# Patient Record
Sex: Female | Born: 1970 | Race: White | Hispanic: No | Marital: Married | Smoking: Never smoker
Health system: Southern US, Community
[De-identification: ages and names within clinical notes are randomized; demographics above are authoritative.]

## PROBLEM LIST (undated history)

## (undated) DIAGNOSIS — J45909 Unspecified asthma, uncomplicated: Secondary | ICD-10-CM

## (undated) DIAGNOSIS — M1990 Unspecified osteoarthritis, unspecified site: Secondary | ICD-10-CM

## (undated) HISTORY — PX: BUNIONECTOMY: SHX129

---

## 1998-09-09 ENCOUNTER — Other Ambulatory Visit: Admission: RE | Admit: 1998-09-09 | Discharge: 1998-09-09 | Payer: Self-pay | Admitting: Obstetrics and Gynecology

## 2011-10-02 ENCOUNTER — Emergency Department (HOSPITAL_COMMUNITY)
Admission: EM | Admit: 2011-10-02 | Discharge: 2011-10-02 | Disposition: A | Discharge status: Home / Self Care | Payer: BC Managed Care – PPO | Attending: Emergency Medicine | Admitting: Emergency Medicine

## 2011-10-02 ENCOUNTER — Emergency Department (HOSPITAL_COMMUNITY): Payer: BC Managed Care – PPO

## 2011-10-02 ENCOUNTER — Other Ambulatory Visit: Payer: Self-pay

## 2011-10-02 DIAGNOSIS — R209 Unspecified disturbances of skin sensation: Secondary | ICD-10-CM | POA: Insufficient documentation

## 2011-10-02 DIAGNOSIS — M129 Arthropathy, unspecified: Secondary | ICD-10-CM | POA: Insufficient documentation

## 2011-10-02 DIAGNOSIS — R079 Chest pain, unspecified: Secondary | ICD-10-CM | POA: Insufficient documentation

## 2011-10-02 DIAGNOSIS — R0789 Other chest pain: Secondary | ICD-10-CM

## 2011-10-02 HISTORY — DX: Unspecified osteoarthritis, unspecified site: M19.90

## 2011-10-02 LAB — COMPREHENSIVE METABOLIC PANEL
Alkaline Phosphatase: 58 U/L (ref 39–117)
BUN: 10 mg/dL (ref 6–23)
Chloride: 102 mEq/L (ref 96–112)
Creatinine, Ser: 0.74 mg/dL (ref 0.50–1.10)
GFR calc Af Amer: >90 mL/min (ref >90)
Glucose, Bld: 89 mg/dL (ref 70–99)
Potassium: 4 mEq/L (ref 3.5–5.1)
Total Bilirubin: 0.3 mg/dL (ref 0.3–1.2)
Total Protein: 7.5 g/dL (ref 6.0–8.3)

## 2011-10-02 LAB — LIPASE, BLOOD: Lipase: 27 U/L (ref 11–59)

## 2011-10-02 LAB — POCT I-STAT, CHEM 8
Calcium, Ion: 1.16 mmol/L (ref 1.12–1.32)
Glucose, Bld: 121 mg/dL — ABNORMAL HIGH (ref 70–99)
HCT: 38 % (ref 36.0–46.0)
Hemoglobin: 12.9 g/dL (ref 12.0–15.0)
TCO2: 25 mmol/L (ref 0–100)

## 2011-10-02 MED ORDER — IBUPROFEN 800 MG PO TABS: 800.0000 mg | ORAL_TABLET | Freq: Three times a day (TID) | ORAL | Status: AC

## 2011-10-02 NOTE — ED Notes (Signed)
Family at bedside. 

## 2011-10-02 NOTE — ED Notes (Signed)
Cardiologist at bedside.  

## 2011-10-02 NOTE — ED Provider Notes (Signed)
Evaluation and management procedures were performed by the PA/NP under my supervision/collaboration.   Dione Booze, MD 10/02/11 [phone]

## 2011-10-02 NOTE — ED Provider Notes (Addendum)
History     CSN: [id number] Arrival date & time: 10/02/2011  1:37 PM   First MD Initiated Contact with Patient 10/02/11 1437      No chief complaint on file. CC:  Chest pain Pleasant 40 year old female with no significant past medical history other than arthritis. She reports fairly constant right-sided chest pain since Sunday. The pain was worse on Sunday and today is approximately 2/10. It initially began while she was doing some cleaning, but denies any specific exertional component. There is been no relation to food although patient still has her gallbladder. Today she states there was tingling in the right upper chest in the right arm. She denies nausea. Denies any shortness of breath to me. She did take some anti-inflammatory pain medications at home with moderate relief   (Consider location/radiation/quality/duration/timing/severity/associated sxs/prior treatment) HPI  Past Medical History  Diagnosis Date  . Arthritis     History reviewed. No pertinent past surgical history.  No family history on file.  History  Substance Use Topics  . Smoking status: Never Smoker   . Smokeless tobacco: Not on file  . Alcohol Use: Yes    OB History    Grav Para Term Preterm Abortions TAB SAB Ect Mult Living                  Review of Systems  All other systems reviewed and are negative.    Allergies  Ciprofloxacin hcl and Penicillins  Home Medications   Current Outpatient Rx  Name Route Sig Dispense Refill  . ALBUTEROL SULFATE HFA 108 (90 BASE) MCG/ACT IN AERS Inhalation Inhale 2 puffs into the lungs every 6 (six) hours as needed. Shortness of breath.     Marland Kitchen FLUTICASONE-SALMETEROL 250-50 MCG/DOSE IN AEPB Inhalation Inhale 1 puff into the lungs daily as needed.        BP 123/81  Pulse 89  Temp(Src) 97.8 F (36.6 C) (Oral)  Resp 16  SpO2 98%  LMP 09/18/2011  Physical Exam  Nursing note and vitals reviewed. Constitutional: She is oriented to person, place, and time.  She appears well-developed and well-nourished.  HENT:  Head: Normocephalic and atraumatic.  Eyes: Conjunctivae and EOM are normal. Pupils are equal, round, and reactive to light.  Neck: Neck supple.  Cardiovascular: Normal rate and regular rhythm.  Exam reveals no gallop and no friction rub.   No murmur heard. Pulmonary/Chest: Breath sounds normal. She has no wheezes. She has no rales. She exhibits no tenderness.  Abdominal: Soft. Bowel sounds are normal. She exhibits no distension. There is no tenderness. There is no rebound and no guarding.  Musculoskeletal: Normal range of motion.  Neurological: She is alert and oriented to person, place, and time. No cranial nerve deficit. Coordination normal.  Skin: Skin is warm and dry. No rash noted.  Psychiatric: She has a normal mood and affect.    ED Course  Procedures (including critical care time)  Labs Reviewed - No data to display No results found.   No diagnosis found.    MDM  Pt is seen and examined;  Initial history and physical completed.  Will follow.          Ryin Ambrosius A. Patrica Duel, MD 10/02/11 1446   Date: 10/02/2011  Rate: 80  Rhythm: normal sinus rhythm  QRS Axis: normal  Intervals: normal  ST/T Wave abnormalities: normal  Conduction Disutrbances:none  Narrative Interpretation:   Old EKG Reviewed: none available   No results found for this or any previous visit.  No results found.     Kierstynn Babich A. Patrica Duel, MD 10/02/11 1530   6:05 PM Spoke with Dr. Dannette Barbara. from [facility]. They will evaluate Ms. Clendenen in the ER and make further recommendations. She remained stable  Araya Roel A. Patrica Duel, MD 10/02/11 1610

## 2011-10-02 NOTE — Consult Note (Signed)
Reason for Consult: Chest pain  Requesting Physician: Dr. Patrica Duel  HPI: This is a 40 y.o. female with a past medical history significant for asthma, and family history of premature coronary disease. Her father is a patient of Dr. Tresa Endo in our practice. She reportedly developed right sided chest pain this Sunday while cleaning her cabinets. The pain is described as dull nonradiating and a focused on the right costochondral border. In addition she's had some sharp radiating pain and numbness and tingling in her right arm. Additionally she complains of some sharp pains in the right upper quadrant, not associated with eating or alleviated with rest or change in position. Her chest pain symptoms are not associated with exertion or relieved by rest and she's not attempted take any medication for it. She reports herself as active exercising with aerobic activities at the gym several times weekly. She denies any chest pain during any of these episodes. Chest also denies nausea, vomiting, presyncope, syncope, dizziness, worsening dyspnea on exertion, or other associated symptoms. At her mother's request he presented to the emergency department for evaluation.  PMHx:  Past Medical History  Diagnosis Date  . Arthritis    History reviewed. No pertinent past surgical history.  FAMHx:  her father had early onset coronary disease in his 76s.  SOCHx:  reports that she has never smoked. She does not have any smokeless tobacco history on file. She reports that she drinks alcohol. She reports that she does not use illicit drugs.  ALLERGIES: Allergies  Allergen Reactions  . Ciprofloxacin Hcl Anaphylaxis  . Penicillins Hives    ROS: A comprehensive review of systems was negative except for: Cardiovascular: positive for chest pain  HOME MEDICATIONS:  (Not in a hospital admission)  HOSPITAL MEDICATIONS: Prior to Admission:  (Not in a hospital admission)  VITALS: Blood pressure 117/69, pulse 77,  temperature 97.6 F (36.4 C), temperature source Oral, resp. rate 15, last menstrual period 09/18/2011, SpO2 96.00%.  PHYSICAL EXAM: General appearance: alert and no distress Neck: no adenopathy, no carotid bruit, no JVD, supple, symmetrical, trachea midline and thyroid not enlarged, symmetric, no tenderness/mass/nodules Lungs: clear to auscultation bilaterally Heart: regular rate and rhythm, S1, S2 normal, no murmur, click, rub or gallop Abdomen: soft, mildly tender to deep palpation. No hepatomegaly, no Murphy's sign was able to be elicited. Extremities: extremities normal, atraumatic, no cyanosis or edema Pulses: 2+ and symmetric Skin: Skin color, texture, turgor normal. No rashes or lesions Neurologic: Grossly normal  LABS: Results for orders placed during the hospital encounter of 10/02/11 (from the past 48 hour(s))  COMPREHENSIVE METABOLIC PANEL     Status: Normal   Collection Time   10/02/11  3:10 PM      Component Value Range Comment   Sodium 137  135 - 145 (mEq/L)    Potassium 4.0  3.5 - 5.1 (mEq/L)    Chloride 102  96 - 112 (mEq/L)    CO2 23  19 - 32 (mEq/L)    Glucose, Bld 89  70 - 99 (mg/dL)    BUN 10  6 - 23 (mg/dL)    Creatinine, Ser 1.47  0.50 - 1.10 (mg/dL)    Calcium 9.2  8.4 - 10.5 (mg/dL)    Total Protein 7.5  6.0 - 8.3 (g/dL)    Albumin 4.2  3.5 - 5.2 (g/dL)    AST 15  0 - 37 (U/L)    ALT 12  0 - 35 (U/L)    Alkaline Phosphatase 58  39 -  117 (U/L)    Total Bilirubin 0.3  0.3 - 1.2 (mg/dL)    GFR calc non Af Amer >90  >90 (mL/min)    GFR calc Af Amer >90  >90 (mL/min)   LIPASE, BLOOD     Status: Normal   Collection Time   10/02/11  3:10 PM      Component Value Range Comment   Lipase 27  11 - 59 (U/L)   PREGNANCY, URINE     Status: Normal   Collection Time   10/02/11  3:23 PM      Component Value Range Comment   Preg Test, Ur NEGATIVE     POCT I-STAT TROPONIN I     Status: Normal   Collection Time   10/02/11  4:47 PM      Component Value Range Comment    Troponin i, poc 0.00  0.00 - 0.08 (ng/mL)    Comment 3            POCT I-STAT, CHEM 8     Status: Abnormal   Collection Time   10/02/11  4:50 PM      Component Value Range Comment   Sodium 139  135 - 145 (mEq/L)    Potassium 4.1  3.5 - 5.1 (mEq/L)    Chloride 101  96 - 112 (mEq/L)    BUN 8  6 - 23 (mg/dL)    Creatinine, Ser 4.09  0.50 - 1.10 (mg/dL)    Glucose, Bld 811 (*) 70 - 99 (mg/dL)    Calcium, Ion 9.14  1.12 - 1.32 (mmol/L)    TCO2 25  0 - 100 (mmol/L)    Hemoglobin 12.9  12.0 - 15.0 (g/dL)    HCT 78.2  95.6 - 21.3 (%)     IMAGING: Dg Chest 2 View  10/02/2011  *RADIOLOGY REPORT*  Clinical Data: Chest pain.  Shortness of breath.  Right upper extremity numbness.  CHEST - 2 VIEW 10/02/2011:  Comparison: None.  Findings: Cardiomediastinal silhouette unremarkable.  Lungs clear. Bronchovascular markings normal.  Pulmonary vascularity normal.  No pleural effusions.  No pneumothorax.  Visualized bony thorax intact.  IMPRESSION: Normal examination.  Original Report Authenticated By: Arnell Sieving, M.D.   EKG: Normal sinus rhythm without ischemic changes.  IMPRESSION: 1. Noncardiac chest pain 2. Possible costochondritis or chest wall pain.  RECOMMENDATION: 1. A course of anti-inflammatory medication. I recommended ibuprofen 600 mg by mouth twice a day x7 days.  2. There is no adequate clear indication for further stress testing as an outpatient. However if her symptoms worsen want to consider stress perfusion imaging. 3. She can be discharged home with no specific followup needed.  Thanks for the consult.  Chrystie Nose, MD Attending Cardiologist [facility]  Time Spent Directly with Patient: 30 minutes  Sheena Donegan C 10/02/2011, 8:58 PM

## 2011-10-02 NOTE — ED Notes (Signed)
MD at bedside. 

## 2011-10-02 NOTE — ED Provider Notes (Signed)
  Physical Exam  BP 117/69  Pulse 77  Temp(Src) 97.6 F (36.4 C) (Oral)  Resp 15  SpO2 96%  LMP 09/18/2011  Physical Exam  ED Course  Procedures Results for orders placed during the hospital encounter of 10/02/11  COMPREHENSIVE METABOLIC PANEL      Component Value Range   Sodium 137  135 - 145 (mEq/L)   Potassium 4.0  3.5 - 5.1 (mEq/L)   Chloride 102  96 - 112 (mEq/L)   CO2 23  19 - 32 (mEq/L)   Glucose, Bld 89  70 - 99 (mg/dL)   BUN 10  6 - 23 (mg/dL)   Creatinine, Ser 4.54  0.50 - 1.10 (mg/dL)   Calcium 9.2  8.4 - 09.8 (mg/dL)   Total Protein 7.5  6.0 - 8.3 (g/dL)   Albumin 4.2  3.5 - 5.2 (g/dL)   AST 15  0 - 37 (U/L)   ALT 12  0 - 35 (U/L)   Alkaline Phosphatase 58  39 - 117 (U/L)   Total Bilirubin 0.3  0.3 - 1.2 (mg/dL)   GFR calc non Af Amer >90  >90 (mL/min)   GFR calc Af Amer >90  >90 (mL/min)  LIPASE, BLOOD      Component Value Range   Lipase 27  11 - 59 (U/L)  PREGNANCY, URINE      Component Value Range   Preg Test, Ur NEGATIVE    POCT I-STAT, CHEM 8      Component Value Range   Sodium 139  135 - 145 (mEq/L)   Potassium 4.1  3.5 - 5.1 (mEq/L)   Chloride 101  96 - 112 (mEq/L)   BUN 8  6 - 23 (mg/dL)   Creatinine, Ser 1.19  0.50 - 1.10 (mg/dL)   Glucose, Bld 147 (*) 70 - 99 (mg/dL)   Calcium, Ion 8.29  5.62 - 1.32 (mmol/L)   TCO2 25  0 - 100 (mmol/L)   Hemoglobin 12.9  12.0 - 15.0 (g/dL)   HCT 13.0  86.5 - 78.4 (%)  POCT I-STAT TROPONIN I      Component Value Range   Troponin i, poc 0.00  0.00 - 0.08 (ng/mL)   Comment 3             MDM 8:54 PM Dr. Rennis Golden has seen the patient. Feels this is less likely cardiac pain. Only risk factor for cardiac disease is family history of early cardiac disease. States he has been greater than 24 hours. Does not feel the stress test as needed outpatient. Recommend ibuprofen and followup with primary care physician if needed. Patient informed of this and is ready for discharge.      Thomasene Lot, [place] 10/02/11  2055

## 2011-10-02 NOTE — ED Notes (Signed)
Pt presents with 2 week h/o chest pain that resolved on it's own x 15 minutes.  Pt reports since Sunday, while she was cleaning with onset of epigastric pain that has been intermittent but x 2 days, has been into mid chest. Pt reports pain radiates into R arm and through to midscapula.  +shortness of breath, -nausea.

## 2013-01-23 IMAGING — CR DG CHEST 2V
2 series · 2 of 2 positions shown · non-contrast
Comparison: None.

CLINICAL DATA: Chest pain.  Shortness of breath.  Right upper
extremity numbness.

CHEST - 2 VIEW 10/02/2011:

[w chest pa]
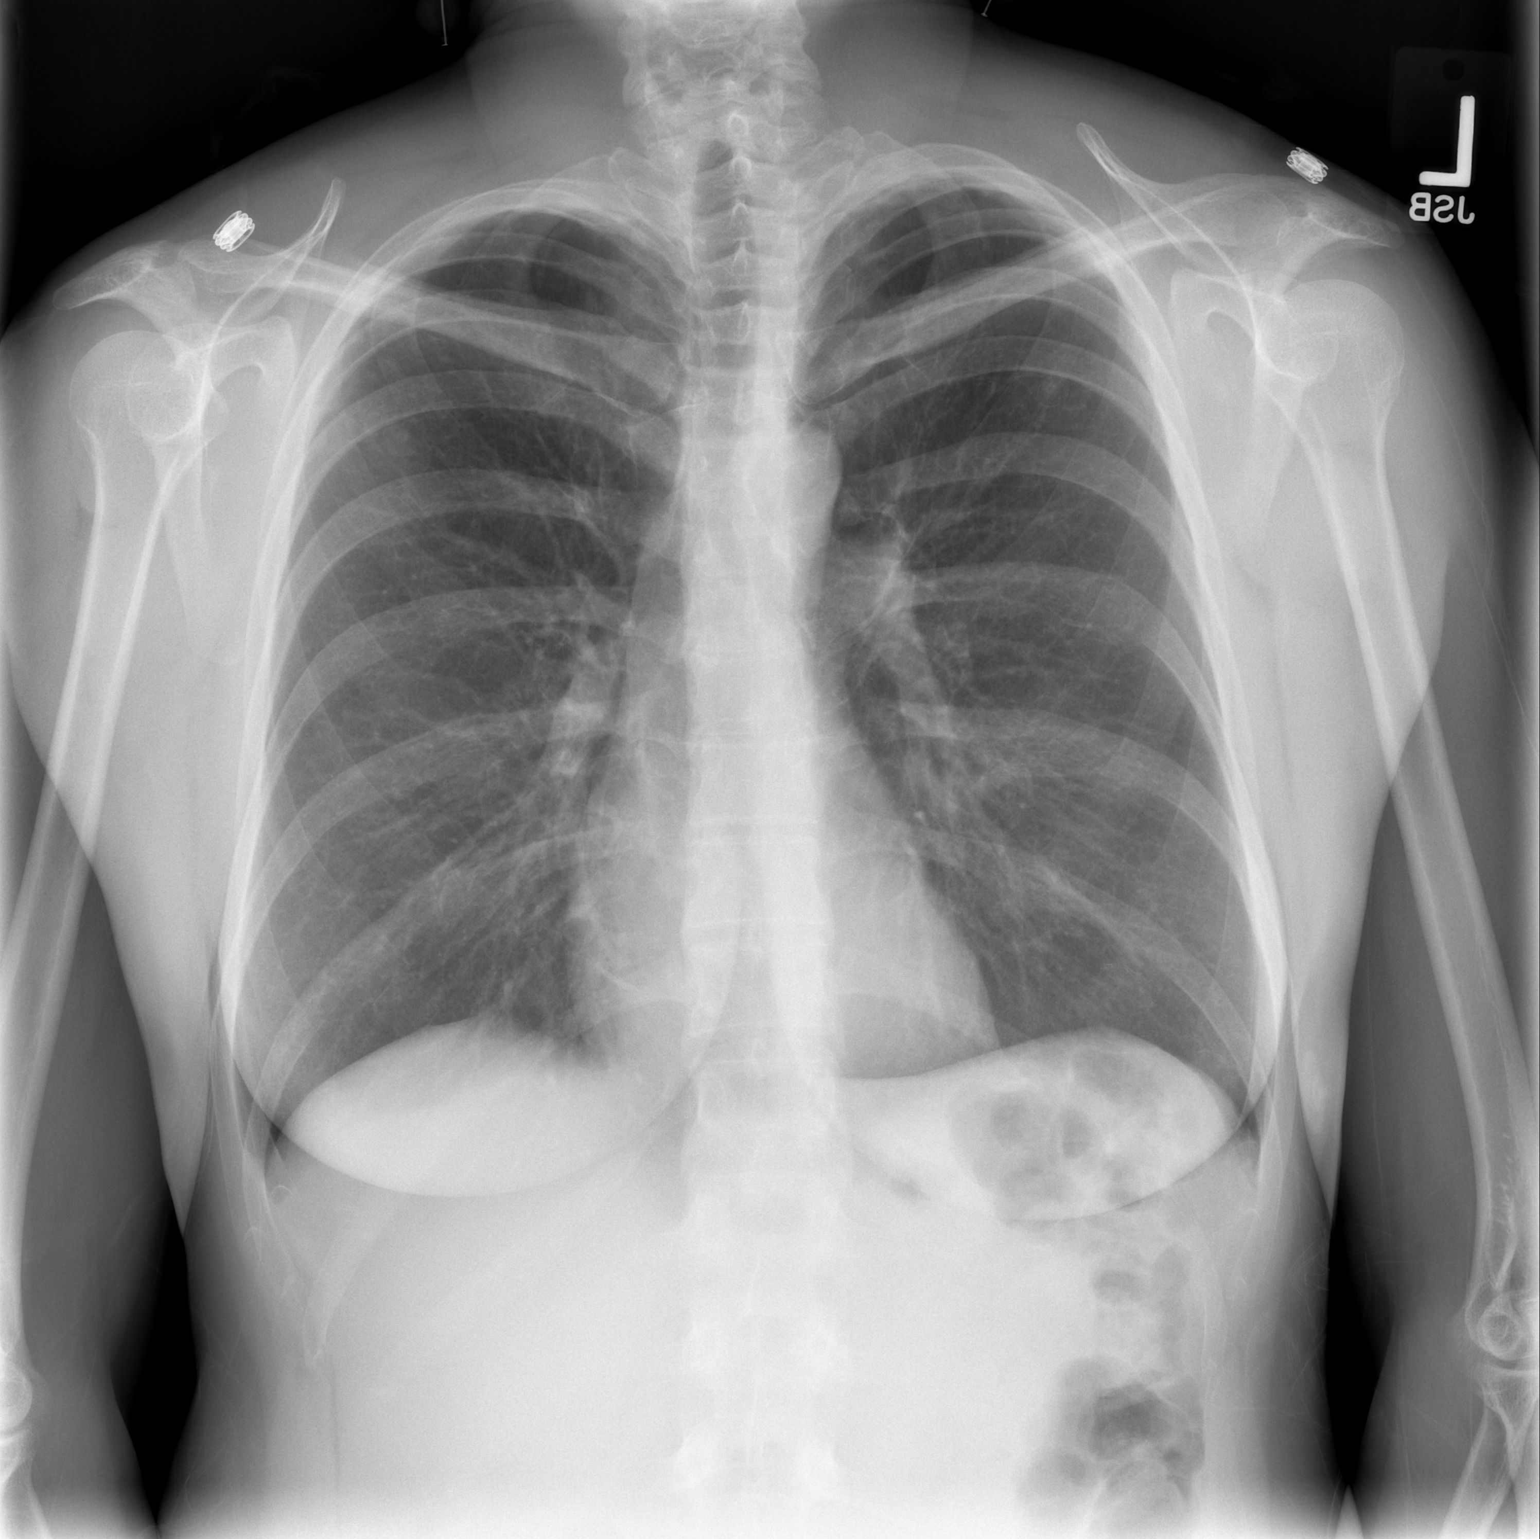

[w chest lat]
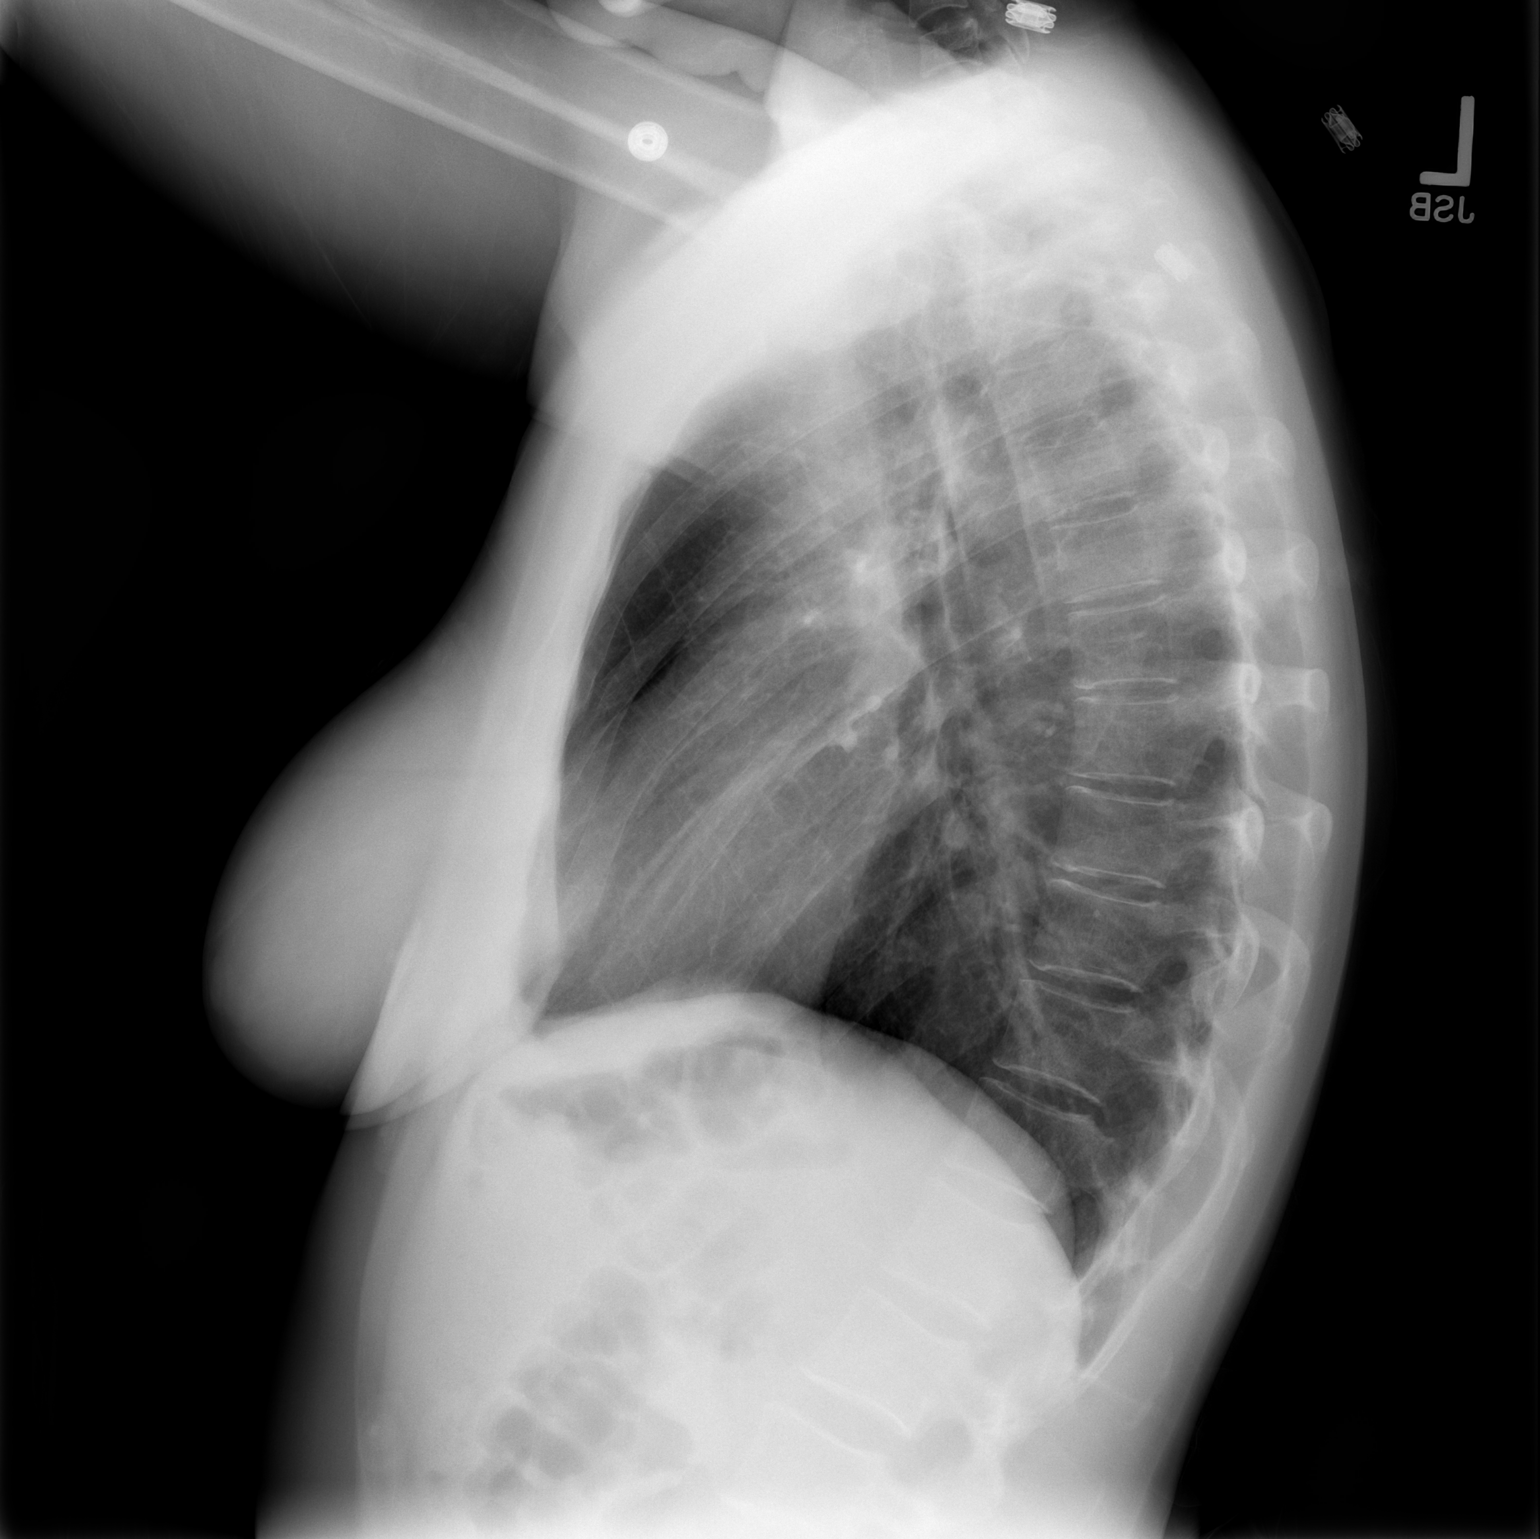

[2 of 2 positions shown; findings below may reference images not displayed]

FINDINGS: Cardiomediastinal silhouette unremarkable.  Lungs clear.
Bronchovascular markings normal.  Pulmonary vascularity normal.  No
pleural effusions.  No pneumothorax.  Visualized bony thorax
intact.
IMPRESSION: Normal examination.

## 2013-06-09 ENCOUNTER — Encounter (INDEPENDENT_AMBULATORY_CARE_PROVIDER_SITE_OTHER): Payer: Self-pay

## 2013-06-25 ENCOUNTER — Ambulatory Visit (INDEPENDENT_AMBULATORY_CARE_PROVIDER_SITE_OTHER): Payer: Self-pay | Admitting: Surgery

## 2013-07-01 ENCOUNTER — Encounter (INDEPENDENT_AMBULATORY_CARE_PROVIDER_SITE_OTHER): Payer: Self-pay | Admitting: Surgery

## 2013-07-01 ENCOUNTER — Other Ambulatory Visit (INDEPENDENT_AMBULATORY_CARE_PROVIDER_SITE_OTHER): Payer: Self-pay

## 2013-07-01 ENCOUNTER — Ambulatory Visit (INDEPENDENT_AMBULATORY_CARE_PROVIDER_SITE_OTHER): Payer: BC Managed Care – PPO | Admitting: Surgery

## 2013-07-01 ENCOUNTER — Telehealth (INDEPENDENT_AMBULATORY_CARE_PROVIDER_SITE_OTHER): Payer: Self-pay | Admitting: General Surgery

## 2013-07-01 VITALS — BP 112/78 | HR 68 | Resp 14 | Ht 63.0 in | Wt 149.6 lb

## 2013-07-01 DIAGNOSIS — J45909 Unspecified asthma, uncomplicated: Secondary | ICD-10-CM | POA: Insufficient documentation

## 2013-07-01 DIAGNOSIS — K8020 Calculus of gallbladder without cholecystitis without obstruction: Secondary | ICD-10-CM

## 2013-07-01 NOTE — Progress Notes (Signed)
Chief Complaint:  Right side abdominal pain and presumed gallstones  History of Present Illness:  Brittany Alvarado is an 42 y.o. female who comes today referred by her primary care physicians in [place], and [place] primary care with a history of recurrent right-sided abdominal pain.  This pain is at times pretty severe, not related to meals, and may last several hours.  No nausea or vomiting or diarrhea noted.  No Fevers.  Pain has been getting more frequent.   I have a report from an in office ultrasound done and read by a radiology group in [place] that showed some shadowing in the gallbladder that may be gallstones or sludge as well as kidney stones. In 2012 she did go to the emergency room with chest pain which was unclear etiology which may have been the gallbladder attack. I gave her a gallbladder booklet and reviewed the procedure and its risk and complications in detail taking approximately 30 minutes to go over such issues.  She would like to repeat the ultrasound to see if they can be more precise about this diagnosis. I will see her back after the ultrasound and we can determine whether we need to proceed with surgery or not.  Past Medical History  Diagnosis Date  . Arthritis     History reviewed. No pertinent past surgical history.  Current Outpatient Prescriptions  Medication Sig Dispense Refill  . albuterol (PROVENTIL HFA;VENTOLIN HFA) 108 (90 BASE) MCG/ACT inhaler Inhale 2 puffs into the lungs every 6 (six) hours as needed. Shortness of breath.       . Fluticasone-Salmeterol (ADVAIR) 250-50 MCG/DOSE AEPB Inhale 1 puff into the lungs daily as needed.         No current facility-administered medications for this visit.   Ciprofloxacin hcl and Penicillins Family History  Problem Relation Age of Onset  . Cancer Mother     uterine  . Cancer Father     prostate  . Cancer Maternal Grandmother     colon   Social History:   reports that she has never smoked. She has never used  smokeless tobacco. She reports that  drinks alcohol. She reports that she does not use illicit drugs.   REVIEW OF SYSTEMS - PERTINENT POSITIVES ONLY: Reviewed in the chart and is positive for asthma and is otherwise negative.  Physical Exam:   Blood pressure 112/78, pulse 68, resp. rate 14, height 5\' 3"  (1.6 m), weight 149 lb 9.6 oz (67.858 kg). Body mass index is 26.51 kg/(m^2).  Gen:  WDWN white female NAD  Neurological: Alert and oriented to person, place, and time. Motor and sensory function is grossly intact  Head: Normocephalic and atraumatic.  Eyes: Conjunctivae are normal. Pupils are equal, round, and reactive to light. No scleral icterus.  Abdomen:  Not reporting any abdominal pain at present LABORATORY RESULTS: No results found for this or any previous visit (from the past 48 hour(s)).  RADIOLOGY RESULTS: No results found.  Problem List: There are no active problems to display for this patient.   Assessment & Plan: Probable gallstones that are probably causing her right upper quadrant pain.  Will recheck her ultrasound here and pay attention to her gallbladder and her kidneys.      Matt B. Daphine Deutscher, MD, [facility] [phone] beeper [phone]  07/01/2013 9:36 AM

## 2013-07-01 NOTE — Telephone Encounter (Signed)
Pt called to report she has a procedure scheduled at [facility] that will need to be rescheduled because her employer cannot let her off at that time to go for it.  Suggested she reschedule it for when she can get there, and let us know so we can then get her in to see Dr. Daphine Deutscher afterwards.

## 2013-07-01 NOTE — Patient Instructions (Signed)

## 2013-07-03 ENCOUNTER — Encounter (INDEPENDENT_AMBULATORY_CARE_PROVIDER_SITE_OTHER): Payer: BC Managed Care – PPO | Admitting: Surgery

## 2013-07-03 ENCOUNTER — Other Ambulatory Visit: Payer: BC Managed Care – PPO

## 2013-09-01 ENCOUNTER — Other Ambulatory Visit: Payer: BC Managed Care – PPO

## 2013-09-04 ENCOUNTER — Encounter (INDEPENDENT_AMBULATORY_CARE_PROVIDER_SITE_OTHER): Payer: BC Managed Care – PPO | Admitting: Surgery

## 2020-06-23 ENCOUNTER — Encounter (INDEPENDENT_AMBULATORY_CARE_PROVIDER_SITE_OTHER): Payer: Self-pay | Admitting: *Deleted

## 2020-08-08 ENCOUNTER — Other Ambulatory Visit (INDEPENDENT_AMBULATORY_CARE_PROVIDER_SITE_OTHER): Payer: Self-pay

## 2020-08-08 DIAGNOSIS — Z800 Family history of malignant neoplasm of digestive organs: Secondary | ICD-10-CM

## 2020-08-16 ENCOUNTER — Other Ambulatory Visit (INDEPENDENT_AMBULATORY_CARE_PROVIDER_SITE_OTHER): Payer: Self-pay

## 2020-08-18 ENCOUNTER — Other Ambulatory Visit (INDEPENDENT_AMBULATORY_CARE_PROVIDER_SITE_OTHER): Payer: Self-pay

## 2020-08-18 DIAGNOSIS — Z800 Family history of malignant neoplasm of digestive organs: Secondary | ICD-10-CM

## 2020-08-19 ENCOUNTER — Encounter (INDEPENDENT_AMBULATORY_CARE_PROVIDER_SITE_OTHER): Payer: Self-pay | Admitting: *Deleted

## 2020-08-19 ENCOUNTER — Telehealth (INDEPENDENT_AMBULATORY_CARE_PROVIDER_SITE_OTHER): Payer: Self-pay

## 2020-08-19 ENCOUNTER — Encounter (INDEPENDENT_AMBULATORY_CARE_PROVIDER_SITE_OTHER): Payer: Self-pay

## 2020-08-19 DIAGNOSIS — Z800 Family history of malignant neoplasm of digestive organs: Secondary | ICD-10-CM

## 2020-08-19 NOTE — Telephone Encounter (Signed)
Patient has Plenvu (copay card) 

## 2020-08-19 NOTE — Telephone Encounter (Signed)
Referring MD/PCP:Daniel   Procedure: Tcs  Reason/Indication:  Family hx of colon ca  Has patient had this procedure before?  no  If so, when, by whom and where?    Is there a family history of colon cancer?  Yes maternal grandmother  Who?  What age when diagnosed?    Is patient diabetic?   no      Does patient have prosthetic heart valve or mechanical valve?  no  Do you have a pacemaker/defibrillator?  no  Has patient ever had endocarditis/atrial fibrillation? no  Does patient use oxygen? no  Has patient had joint replacement within last 12 months?  no  Is patient constipated or do they take laxatives? yes  Does patient have a history of alcohol/drug use?  no  Is patient on blood thinner such as Coumadin, Plavix and/or Aspirin? no  Medications: Albuterol prn, Advair 250/50 bid, linzess 72 mcg daily, hctz 25mg  daily  Allergies: cipro  Medication Adjustment per Dr none  Procedure date & time: 09/21/20

## 2020-08-25 MED ORDER — PLENVU 140 G PO SOLR: 1.0000 | Freq: Once | ORAL | 0 refills | Status: AC

## 2020-08-25 NOTE — Telephone Encounter (Signed)
Ok to schedule.  Thanks,  Rehan Holness Castaneda Mayorga, MD Gastroenterology and Hepatology [facility]  

## 2020-09-19 ENCOUNTER — Other Ambulatory Visit: Payer: Self-pay

## 2020-09-19 ENCOUNTER — Other Ambulatory Visit (HOSPITAL_COMMUNITY)
Admission: RE | Admit: 2020-09-19 | Discharge: 2020-09-19 | Disposition: A | Discharge status: Home / Self Care | Payer: BC Managed Care – PPO | Source: Ambulatory Visit | Attending: Gastroenterology | Admitting: Gastroenterology

## 2020-09-19 DIAGNOSIS — Z881 Allergy status to other antibiotic agents status: Secondary | ICD-10-CM | POA: Diagnosis not present

## 2020-09-19 DIAGNOSIS — K648 Other hemorrhoids: Secondary | ICD-10-CM | POA: Diagnosis not present

## 2020-09-19 DIAGNOSIS — Z20822 Contact with and (suspected) exposure to covid-19: Secondary | ICD-10-CM | POA: Insufficient documentation

## 2020-09-19 DIAGNOSIS — Z1211 Encounter for screening for malignant neoplasm of colon: Secondary | ICD-10-CM | POA: Diagnosis not present

## 2020-09-19 DIAGNOSIS — Z01812 Encounter for preprocedural laboratory examination: Secondary | ICD-10-CM | POA: Insufficient documentation

## 2020-09-19 DIAGNOSIS — Z880 Allergy status to penicillin: Secondary | ICD-10-CM | POA: Diagnosis not present

## 2020-09-19 DIAGNOSIS — Z7951 Long term (current) use of inhaled steroids: Secondary | ICD-10-CM | POA: Diagnosis not present

## 2020-09-19 DIAGNOSIS — Z79899 Other long term (current) drug therapy: Secondary | ICD-10-CM | POA: Diagnosis not present

## 2020-09-19 DIAGNOSIS — Z800 Family history of malignant neoplasm of digestive organs: Secondary | ICD-10-CM | POA: Diagnosis not present

## 2020-09-19 LAB — PREGNANCY, URINE: Preg Test, Ur: NEGATIVE

## 2020-09-20 LAB — SARS CORONAVIRUS 2 (TAT 6-24 HRS): SARS Coronavirus 2: NEGATIVE

## 2020-09-21 ENCOUNTER — Ambulatory Visit (HOSPITAL_COMMUNITY)
Admission: RE | Admit: 2020-09-21 | Discharge: 2020-09-21 | Disposition: A | Discharge status: Home / Self Care | Payer: BC Managed Care – PPO | Attending: Gastroenterology | Admitting: Gastroenterology

## 2020-09-21 ENCOUNTER — Encounter (HOSPITAL_COMMUNITY): Payer: Self-pay | Admitting: Gastroenterology

## 2020-09-21 ENCOUNTER — Encounter (HOSPITAL_COMMUNITY)
Admission: RE | Disposition: A | Discharge status: Home / Self Care | Payer: Self-pay | Source: Home / Self Care | Attending: Gastroenterology

## 2020-09-21 ENCOUNTER — Other Ambulatory Visit: Payer: Self-pay

## 2020-09-21 ENCOUNTER — Ambulatory Visit (HOSPITAL_COMMUNITY): Payer: BC Managed Care – PPO | Admitting: Anesthesiology

## 2020-09-21 ENCOUNTER — Encounter (INDEPENDENT_AMBULATORY_CARE_PROVIDER_SITE_OTHER): Payer: Self-pay | Admitting: *Deleted

## 2020-09-21 DIAGNOSIS — K648 Other hemorrhoids: Secondary | ICD-10-CM | POA: Insufficient documentation

## 2020-09-21 DIAGNOSIS — Z1211 Encounter for screening for malignant neoplasm of colon: Secondary | ICD-10-CM | POA: Diagnosis not present

## 2020-09-21 DIAGNOSIS — Z800 Family history of malignant neoplasm of digestive organs: Secondary | ICD-10-CM | POA: Insufficient documentation

## 2020-09-21 DIAGNOSIS — Z7951 Long term (current) use of inhaled steroids: Secondary | ICD-10-CM | POA: Insufficient documentation

## 2020-09-21 DIAGNOSIS — Z20822 Contact with and (suspected) exposure to covid-19: Secondary | ICD-10-CM | POA: Insufficient documentation

## 2020-09-21 DIAGNOSIS — Z880 Allergy status to penicillin: Secondary | ICD-10-CM | POA: Insufficient documentation

## 2020-09-21 DIAGNOSIS — Z881 Allergy status to other antibiotic agents status: Secondary | ICD-10-CM | POA: Insufficient documentation

## 2020-09-21 DIAGNOSIS — Z79899 Other long term (current) drug therapy: Secondary | ICD-10-CM | POA: Insufficient documentation

## 2020-09-21 HISTORY — DX: Unspecified asthma, uncomplicated: J45.909

## 2020-09-21 HISTORY — PX: COLONOSCOPY WITH PROPOFOL: SHX5780

## 2020-09-21 LAB — HM COLONOSCOPY

## 2020-09-21 SURGERY — COLONOSCOPY WITH PROPOFOL
Anesthesia: General

## 2020-09-21 MED ORDER — STERILE WATER FOR IRRIGATION IR SOLN
Status: DC | PRN
  Administered 2020-09-21: 1.5 mL

## 2020-09-21 MED ORDER — PROPOFOL 10 MG/ML IV BOLUS
INTRAVENOUS | Status: DC | PRN
  Administered 2020-09-21: 10 mg via INTRAVENOUS
  Administered 2020-09-21 (×2): 30 mg via INTRAVENOUS
  Administered 2020-09-21 (×2): 50 mg via INTRAVENOUS

## 2020-09-21 MED ORDER — LACTATED RINGERS IV SOLN
Freq: Once | INTRAVENOUS | Status: AC
  Administered 2020-09-21: 1000 mL via INTRAVENOUS

## 2020-09-21 MED ORDER — LACTATED RINGERS IV SOLN: INTRAVENOUS | Status: DC | PRN

## 2020-09-21 MED ORDER — PROPOFOL 500 MG/50ML IV EMUL
INTRAVENOUS | Status: DC | PRN
  Administered 2020-09-21: 150 ug/kg/min via INTRAVENOUS

## 2020-09-21 NOTE — Anesthesia Preprocedure Evaluation (Signed)
Anesthesia Evaluation  Patient identified by MRN, date of birth, ID band Patient awake    Reviewed: Allergy & Precautions, NPO status , Patient's Chart, lab work & pertinent test results  History of Anesthesia Complications Negative for: history of anesthetic complications  Airway Mallampati: II  TM Distance: >3 FB Neck ROM: Full    Dental  (+) Dental Advisory Given, Teeth Intact   Pulmonary asthma ,    Pulmonary exam normal breath sounds clear to auscultation       Cardiovascular Exercise Tolerance: Good negative cardio ROS Normal cardiovascular exam Rhythm:Regular Rate:Normal     Neuro/Psych    GI/Hepatic negative GI ROS, Neg liver ROS,   Endo/Other  negative endocrine ROS  Renal/GU negative Renal ROS     Musculoskeletal  (+) Arthritis ,   Abdominal   Peds  Hematology   Anesthesia Other Findings Bilateral ankle edema - HCTZ  Reproductive/Obstetrics                             Anesthesia Physical Anesthesia Plan  ASA: II  Anesthesia Plan: General   Post-op Pain Management:    Induction: Intravenous  PONV Risk Score and Plan: TIVA  Airway Management Planned: Nasal Cannula and Natural Airway  Additional Equipment:   Intra-op Plan:   Post-operative Plan:   Informed Consent: I have reviewed the patients History and Physical, chart, labs and discussed the procedure including the risks, benefits and alternatives for the proposed anesthesia with the patient or authorized representative who has indicated his/her understanding and acceptance.     Dental advisory given  Plan Discussed with: CRNA and Surgeon  Anesthesia Plan Comments:         Anesthesia Quick Evaluation

## 2020-09-21 NOTE — Transfer of Care (Signed)
Immediate Anesthesia Transfer of Care Note  Patient: Brittany Alvarado  Procedure(s) Performed: COLONOSCOPY WITH PROPOFOL (N/A )  Patient Location: Endoscopy Unit  Anesthesia Type:General  Level of Consciousness: awake  Airway & Oxygen Therapy: Patient Spontanous Breathing  Post-op Assessment: Report given to RN  Post vital signs: Reviewed  Last Vitals:  Vitals Value Taken Time  BP 117/79 09/21/20 1003  Temp 36.8 C 09/21/20 1003  Pulse 75 09/21/20 1003  Resp 20 09/21/20 1003  SpO2 100 % 09/21/20 1003    Last Pain:  Vitals:   09/21/20 1003  TempSrc: Oral  PainSc: 0-No pain      Patients Stated Pain Goal: 8 (09/21/20 0859)  Complications: No complications documented.

## 2020-09-21 NOTE — Discharge Instructions (Signed)
Colonoscopy, Adult, Care After This sheet gives you information about how to care for yourself after your procedure. Your doctor may also give you more specific instructions. If you have problems or questions, call your doctor. What can I expect after the procedure? After the procedure, it is common to have:  A small amount of blood in your poop (stool) for 24 hours.  Some gas.  Mild cramping or bloating in your belly (abdomen). Follow these instructions at home: Eating and drinking   Drink enough fluid to keep your pee (urine) pale yellow.  Follow instructions from your doctor about what you cannot eat or drink.  Return to your normal diet as told by your doctor. Avoid heavy or fried foods that are hard to digest. Activity  Rest as told by your doctor.  Do not sit for a long time without moving. Get up to take short walks every 1-2 hours. This is important. Ask for help if you feel weak or unsteady.  Return to your normal activities as told by your doctor. Ask your doctor what activities are safe for you. To help cramping and bloating:   Try walking around.  Put heat on your belly as told by your doctor. Use the heat source that your doctor recommends, such as a moist heat pack or a heating pad. ? Put a towel between your skin and the heat source. ? Leave the heat on for 20-30 minutes. ? Remove the heat if your skin turns bright red. This is very important if you are unable to feel pain, heat, or cold. You may have a greater risk of getting burned. General instructions  For the first 24 hours after the procedure: ? Do not drive or use machinery. ? Do not sign important documents. ? Do not drink alcohol. ? Do your daily activities more slowly than normal. ? Eat foods that are soft and easy to digest.  Take over-the-counter or prescription medicines only as told by your doctor.  Keep all follow-up visits as told by your doctor. This is important. Contact a doctor  if:  You have blood in your poop 2-3 days after the procedure. Get help right away if:  You have more than a small amount of blood in your poop.  You see large clumps of tissue (blood clots) in your poop.  Your belly is swollen.  You feel like you may vomit (nauseous).  You vomit.  You have a fever.  You have belly pain that gets worse, and medicine does not help your pain. Summary  After the procedure, it is common to have a small amount of blood in your poop. You may also have mild cramping and bloating in your belly.  For the first 24 hours after the procedure, do not drive or use machinery, do not sign important documents, and do not drink alcohol.  Get help right away if you have a lot of blood in your poop, feel like you may vomit, have a fever, or have more belly pain. This information is not intended to replace advice given to you by your health care provider. Make sure you discuss any questions you have with your health care provider. Document Revised: 05/11/2019 Document Reviewed: 05/11/2019 Elsevier Patient Education  2020 [employer]. Hemorrhoids Hemorrhoids are swollen veins that may develop:  In the butt (rectum). These are called internal hemorrhoids.  Around the opening of the butt (anus). These are called external hemorrhoids. Hemorrhoids can cause pain, itching, or bleeding. Most of  the time, they do not cause serious problems. They usually get better with diet changes, lifestyle changes, and other home treatments. What are the causes? This condition may be caused by:  Having trouble pooping (constipation).  Pushing hard (straining) to poop.  Watery poop (diarrhea).  Pregnancy.  Being very overweight (obese).  Sitting for long periods of time.  Heavy lifting or other activity that causes you to strain.  Anal sex.  Riding a bike for a long period of time. What are the signs or symptoms? Symptoms of this condition include:  Pain.  Itching  or soreness in the butt.  Bleeding from the butt.  Leaking poop.  Swelling in the area.  One or more lumps around the opening of your butt. How is this diagnosed? A doctor can often diagnose this condition by looking at the affected area. The doctor may also:  Do an exam that involves feeling the area with a gloved hand (digital rectal exam).  Examine the area inside your butt using a small tube (anoscope).  Order blood tests. This may be done if you have lost a lot of blood.  Have you get a test that involves looking inside the colon using a flexible tube with a camera on the end (sigmoidoscopy or colonoscopy). How is this treated? This condition can usually be treated at home. Your doctor may tell you to change what you eat, make lifestyle changes, or try home treatments. If these do not help, procedures can be done to remove the hemorrhoids or make them smaller. These may involve:  Placing rubber bands at the base of the hemorrhoids to cut off their blood supply.  Injecting medicine into the hemorrhoids to shrink them.  Shining a type of light energy onto the hemorrhoids to cause them to fall off.  Doing surgery to remove the hemorrhoids or cut off their blood supply. Follow these instructions at home: Eating and drinking   Eat foods that have a lot of fiber in them. These include whole grains, beans, nuts, fruits, and vegetables.  Ask your doctor about taking products that have added fiber (fibersupplements).  Reduce the amount of fat in your diet. You can do this by: ? Eating low-fat dairy products. ? Eating less red meat. ? Avoiding processed foods.  Drink enough fluid to keep your pee (urine) pale yellow. Managing pain and swelling   Take a warm-water bath (sitz bath) for 20 minutes to ease pain. Do this 3-4 times a day. You may do this in a bathtub or using a portable sitz bath that fits over the toilet.  If told, put ice on the painful area. It may be helpful  to use ice between your warm baths. ? Put ice in a plastic bag. ? Place a towel between your skin and the bag. ? Leave the ice on for 20 minutes, 2-3 times a day. General instructions  Take over-the-counter and prescription medicines only as told by your doctor. ? Medicated creams and medicines may be used as told.  Exercise often. Ask your doctor how much and what kind of exercise is best for you.  Go to the bathroom when you have the urge to poop. Do not wait.  Avoid pushing too hard when you poop.  Keep your butt dry and clean. Use wet toilet paper or moist towelettes after pooping.  Do not sit on the toilet for a long time.  Keep all follow-up visits as told by your doctor. This is important. Contact a  doctor if you:  Have pain and swelling that do not get better with treatment or medicine.  Have trouble pooping.  Cannot poop.  Have pain or swelling outside the area of the hemorrhoids. Get help right away if you have:  Bleeding that will not stop. Summary  Hemorrhoids are swollen veins in the butt or around the opening of the butt.  They can cause pain, itching, or bleeding.  Eat foods that have a lot of fiber in them. These include whole grains, beans, nuts, fruits, and vegetables.  Take a warm-water bath (sitz bath) for 20 minutes to ease pain. Do this 3-4 times a day. This information is not intended to replace advice given to you by your health care provider. Make sure you discuss any questions you have with your health care provider. Document Revised: 10/23/2018 Document Reviewed: 03/06/2018 Elsevier Patient Education  2020 [employer].   You are being discharged to home.  Resume your previous diet.  Your physician has recommended a repeat colonoscopy in 10 years for screening purposes.

## 2020-09-21 NOTE — Op Note (Signed)
[facility] Patient Name: Brittany Alvarado Procedure Date: 09/21/2020 9:21 AM MRN: [id number] Date of Birth: [birth year] Attending MD: Katrinka Blazing ,  CSN: [id number] Age: 49 Admit Type: Outpatient Procedure:                Colonoscopy Indications:              Screening for colorectal malignant neoplasm Providers:                Katrinka Blazing, Angelica Ran, Sheran Fava,                            Burke Keels, Technician Referring MD:              Medicines:                Monitored Anesthesia Care Complications:            No immediate complications. Estimated Blood Loss:     Estimated blood loss: none. Procedure:                Pre-Anesthesia Assessment:                           - Prior to the procedure, a History and Physical                            was performed, and patient medications, allergies                            and sensitivities were reviewed. The patient's                            tolerance of previous anesthesia was reviewed.                           - The risks and benefits of the procedure and the                            sedation options and risks were discussed with the                            patient. All questions were answered and informed                            consent was obtained.                           - ASA Grade Assessment: II - A patient with mild                            systemic disease.                           After obtaining informed consent, the colonoscope                            was passed under direct vision. Throughout the  procedure, the patient's blood pressure, pulse, and                            oxygen saturations were monitored continuously. The                            [id number]-[id number] ([id number]) scope was introduced through                            the anus and advanced to the the cecum, identified                            by appendiceal orifice and ileocecal valve.  The                            colonoscopy was performed without difficulty. The                            patient tolerated the procedure well. The quality                            of the bowel preparation was good. Scope withdrawal                            time was 12 minutes. Scope In: 9:39:40 AM Scope Out: 9:58:59 AM Scope Withdrawal Time: 0 hours 12 minutes 48 seconds  Total Procedure Duration: 0 hours 19 minutes 19 seconds  Findings:      The perianal and digital rectal examinations were normal.      The colon (entire examined portion) appeared normal.      Non-bleeding internal hemorrhoids were found during retroflexion. The       hemorrhoids were small. Impression:               - The entire examined colon is normal.                           - Non-bleeding internal hemorrhoids.                           - No specimens collected. Moderate Sedation:      Per Anesthesia Care Recommendation:           - Discharge patient to home (ambulatory).                           - Resume previous diet.                           - Repeat colonoscopy in 10 years for screening                            purposes. Procedure Code(s):        --- Professional ---                           D6644, GC, Colorectal cancer screening; colonoscopy  on individual not meeting criteria for high risk Diagnosis Code(s):        --- Professional ---                           Z12.11, Encounter for screening for malignant                            neoplasm of colon                           K64.8, Other hemorrhoids CPT copyright 2019 American Medical Association. All rights reserved. The codes documented in this report are preliminary and upon coder review may  be revised to meet current compliance requirements. Katrinka Blazing, MD Katrinka Blazing,  09/21/2020 10:04:54 AM This report has been signed electronically. Number of Addenda: 0

## 2020-09-21 NOTE — H&P (Signed)
Brittany Alvarado is an 49 y.o. female.   Chief Complaint: Colorectal cancer screening HPI: 49 year old female with past medical history of asthma, who comes to the hospital to undergo screening colonoscopy.  The patient has never had a colonoscopy in the past.  She denies having any complaints such as melena, hematochezia, abdominal pain or distention, change in her bowel movement consistency or frequency, no changes in her weight recently.  Patient reports that her grandmother had colon cancer when she was diagnosed when she was older than 60 years.   Past Medical History:  Diagnosis Date  . Arthritis   . Asthma     Past Surgical History:  Procedure Laterality Date  . BUNIONECTOMY Bilateral     Family History  Problem Relation Age of Onset  . Cancer Mother        uterine  . Cancer Father        prostate  . Cancer Maternal Grandmother        colon   Social History:  reports that she has never smoked. She has never used smokeless tobacco. She reports current alcohol use. She reports that she does not use drugs.  Allergies:  Allergies  Allergen Reactions  . Ciprofloxacin Hcl Anaphylaxis  . Penicillins Hives    Medications Prior to Admission  Medication Sig Dispense Refill  . albuterol (PROVENTIL HFA;VENTOLIN HFA) 108 (90 BASE) MCG/ACT inhaler Inhale 2 puffs into the lungs every 6 (six) hours as needed. Shortness of breath.     . Fluticasone-Salmeterol (ADVAIR) 250-50 MCG/DOSE AEPB Inhale 1 puff into the lungs daily as needed.      . hydrochlorothiazide (HYDRODIURIL) 25 MG tablet Take 25 mg by mouth daily.    Marland Kitchen linaclotide (LINZESS) 72 MCG capsule Take 72 mcg by mouth daily before breakfast.      Results for orders placed or performed during the hospital encounter of 09/19/20 (from the past 48 hour(s))  Pregnancy, urine     Status: None   Collection Time: 09/19/20  4:08 PM  Result Value Ref Range   Preg Test, Ur NEGATIVE NEGATIVE    Comment:        THE SENSITIVITY OF  THIS METHODOLOGY IS >20 mIU/mL. Performed at [facility], [address] [place], [place] [address]    No results found.  Review of Systems  Constitutional: Negative.   HENT: Negative.   Eyes: Negative.   Respiratory: Negative.   Cardiovascular: Negative.   Gastrointestinal: Negative.   Endocrine: Negative.   Genitourinary: Negative.   Musculoskeletal: Negative.   Skin: Negative.   Allergic/Immunologic: Negative.   Neurological: Negative.   Hematological: Negative.   Psychiatric/Behavioral: Negative.     Blood pressure 121/73, pulse 68, temperature 97.8 F (36.6 C), temperature source Oral, resp. rate 12, height 5\' 4"  (1.626 m), weight 65.8 kg, last menstrual period 03/03/2019, SpO2 98 %. Physical Exam  GENERAL: The patient is AO x3, in no acute distress. HEENT: Head is normocephalic and atraumatic. EOMI are intact. Mouth is well hydrated and without lesions. NECK: Supple. No masses LUNGS: Clear to auscultation. No presence of rhonchi/wheezing/rales. Adequate chest expansion HEART: RRR, normal s1 and s2. ABDOMEN: Soft, nontender, no guarding, no peritoneal signs, and nondistended. BS +. No masses. EXTREMITIES: Without any cyanosis, clubbing, rash, lesions or edema. NEUROLOGIC: AOx3, no focal motor deficit. SKIN: no jaundice, no rashes  Assessment/Plan 49 year old female with past medical history of asthma, who comes to the hospital to undergo screening colonoscopy. The patient is at average risk for  colorectal cancer.  We will proceed with colonoscopy today.   Dolores Frame, MD 09/21/2020, 9:32 AM

## 2020-09-21 NOTE — Anesthesia Postprocedure Evaluation (Signed)
Anesthesia Post Note  Patient: Brittany Alvarado  Procedure(s) Performed: COLONOSCOPY WITH PROPOFOL (N/A )  Patient location during evaluation: PACU Anesthesia Type: General Level of consciousness: awake and alert and oriented Pain management: pain level controlled Vital Signs Assessment: post-procedure vital signs reviewed and stable Respiratory status: spontaneous breathing Cardiovascular status: blood pressure returned to baseline and stable Postop Assessment: no apparent nausea or vomiting Anesthetic complications: no   No complications documented.   Last Vitals:  Vitals:   09/21/20 0859 09/21/20 1003  BP: 121/73 117/79  Pulse: 68 75  Resp: 12 20  Temp: 36.6 C 36.8 C  SpO2: 98% 100%    Last Pain:  Vitals:   09/21/20 1003  TempSrc: Oral  PainSc: 0-No pain                 Shaketta Rill

## 2020-09-27 ENCOUNTER — Encounter (HOSPITAL_COMMUNITY): Payer: Self-pay | Admitting: Gastroenterology
